# Patient Record
Sex: Female | Born: 1970 | Race: White | Hispanic: No | Marital: Married
Health system: Southern US, Community
[De-identification: ages and names within clinical notes are randomized; demographics above are authoritative.]

---

## 2010-10-06 ENCOUNTER — Ambulatory Visit: Payer: Self-pay | Admitting: Family Medicine

## 2011-01-13 ENCOUNTER — Ambulatory Visit: Payer: Self-pay | Admitting: Family Medicine

## 2011-08-02 ENCOUNTER — Ambulatory Visit: Payer: Self-pay | Admitting: Family Medicine

## 2011-08-22 ENCOUNTER — Ambulatory Visit: Payer: Self-pay | Admitting: Otolaryngology

## 2011-08-29 ENCOUNTER — Ambulatory Visit: Payer: Self-pay | Admitting: Otolaryngology

## 2011-08-29 LAB — PREGNANCY, URINE: Pregnancy Test, Urine: NEGATIVE m[IU]/mL

## 2011-10-01 IMAGING — CR DG FOOT COMPLETE 3+V*L*
1 series · 3 of 3 positions shown · non-contrast
Comparison: none

REASON FOR EXAM: sprain of lt foot
COMMENTS:

PROCEDURE:     MDR - MDR FOOT LT COMP W/OBLQUES  - October 06, 2010 [DATE]
RESULT:     No fracture, dislocation or other acute bony abnormality is
identified. In the lateral view there is incidentally noted a small plantar
calcaneal spur.

[Series 1: view not recorded · 0.17mm/px · 3 of 3 slices shown]
[im 1/3]
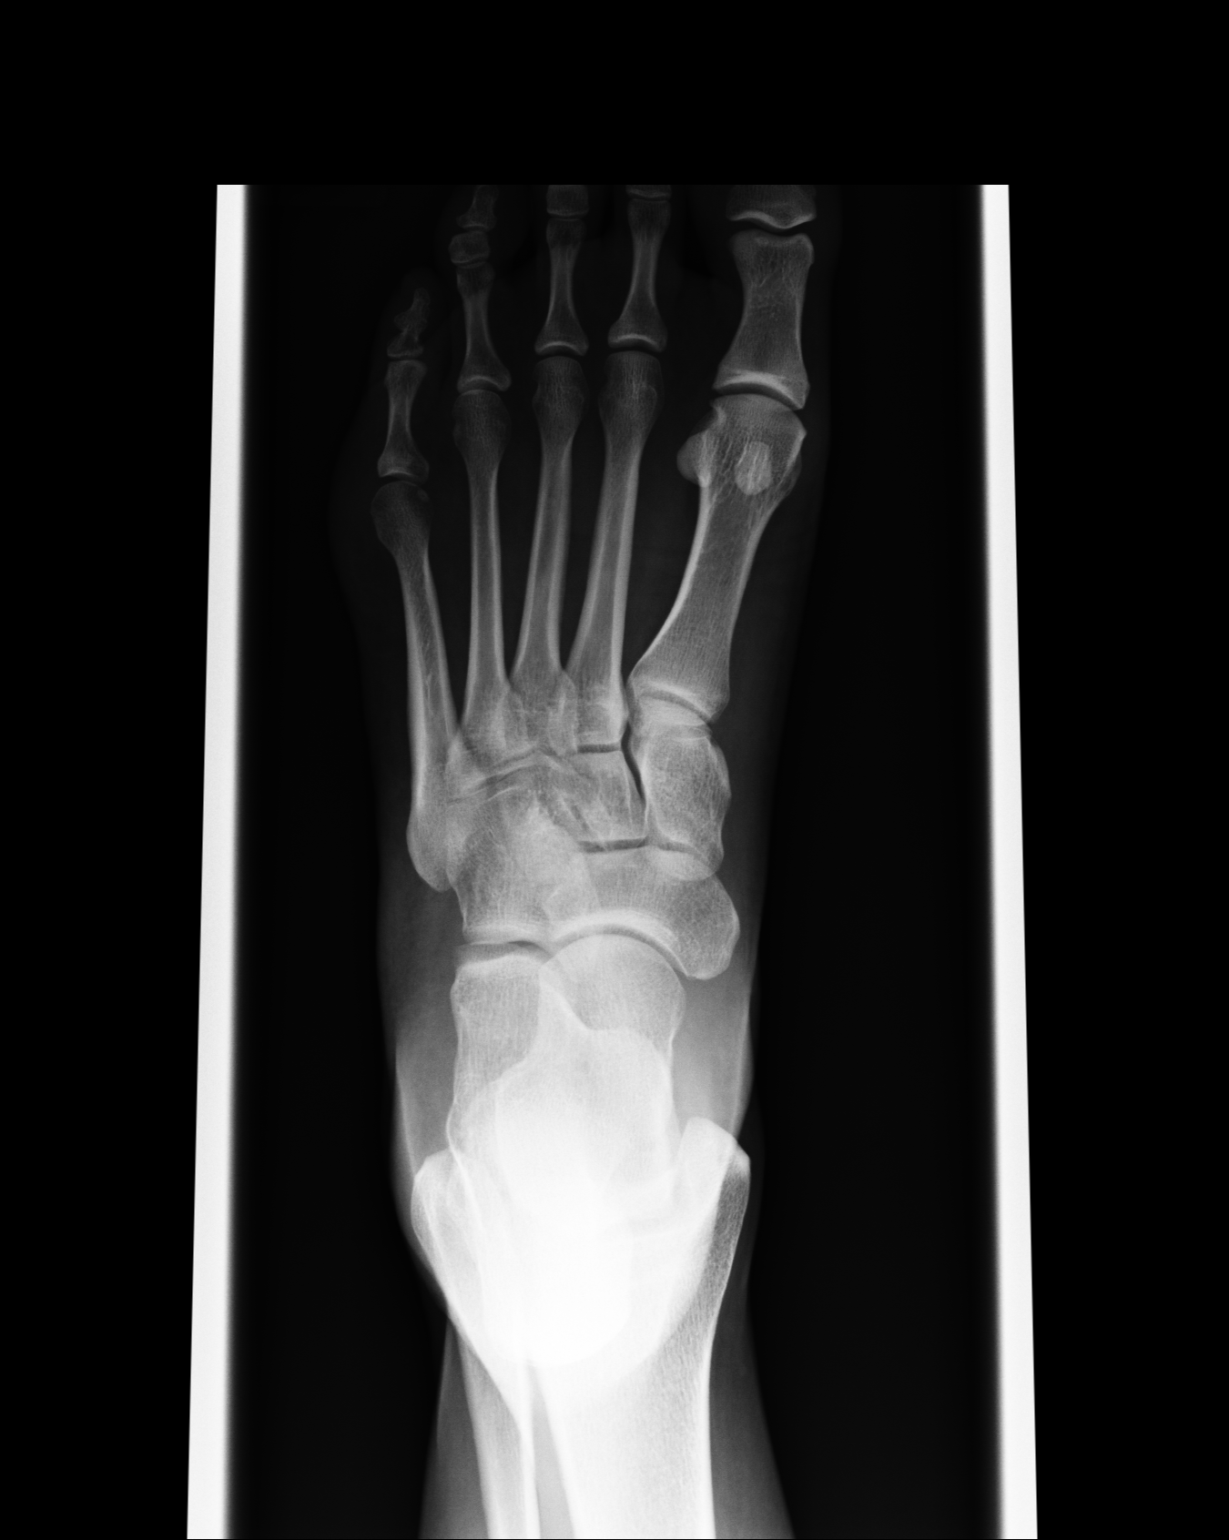
[im 2/3]
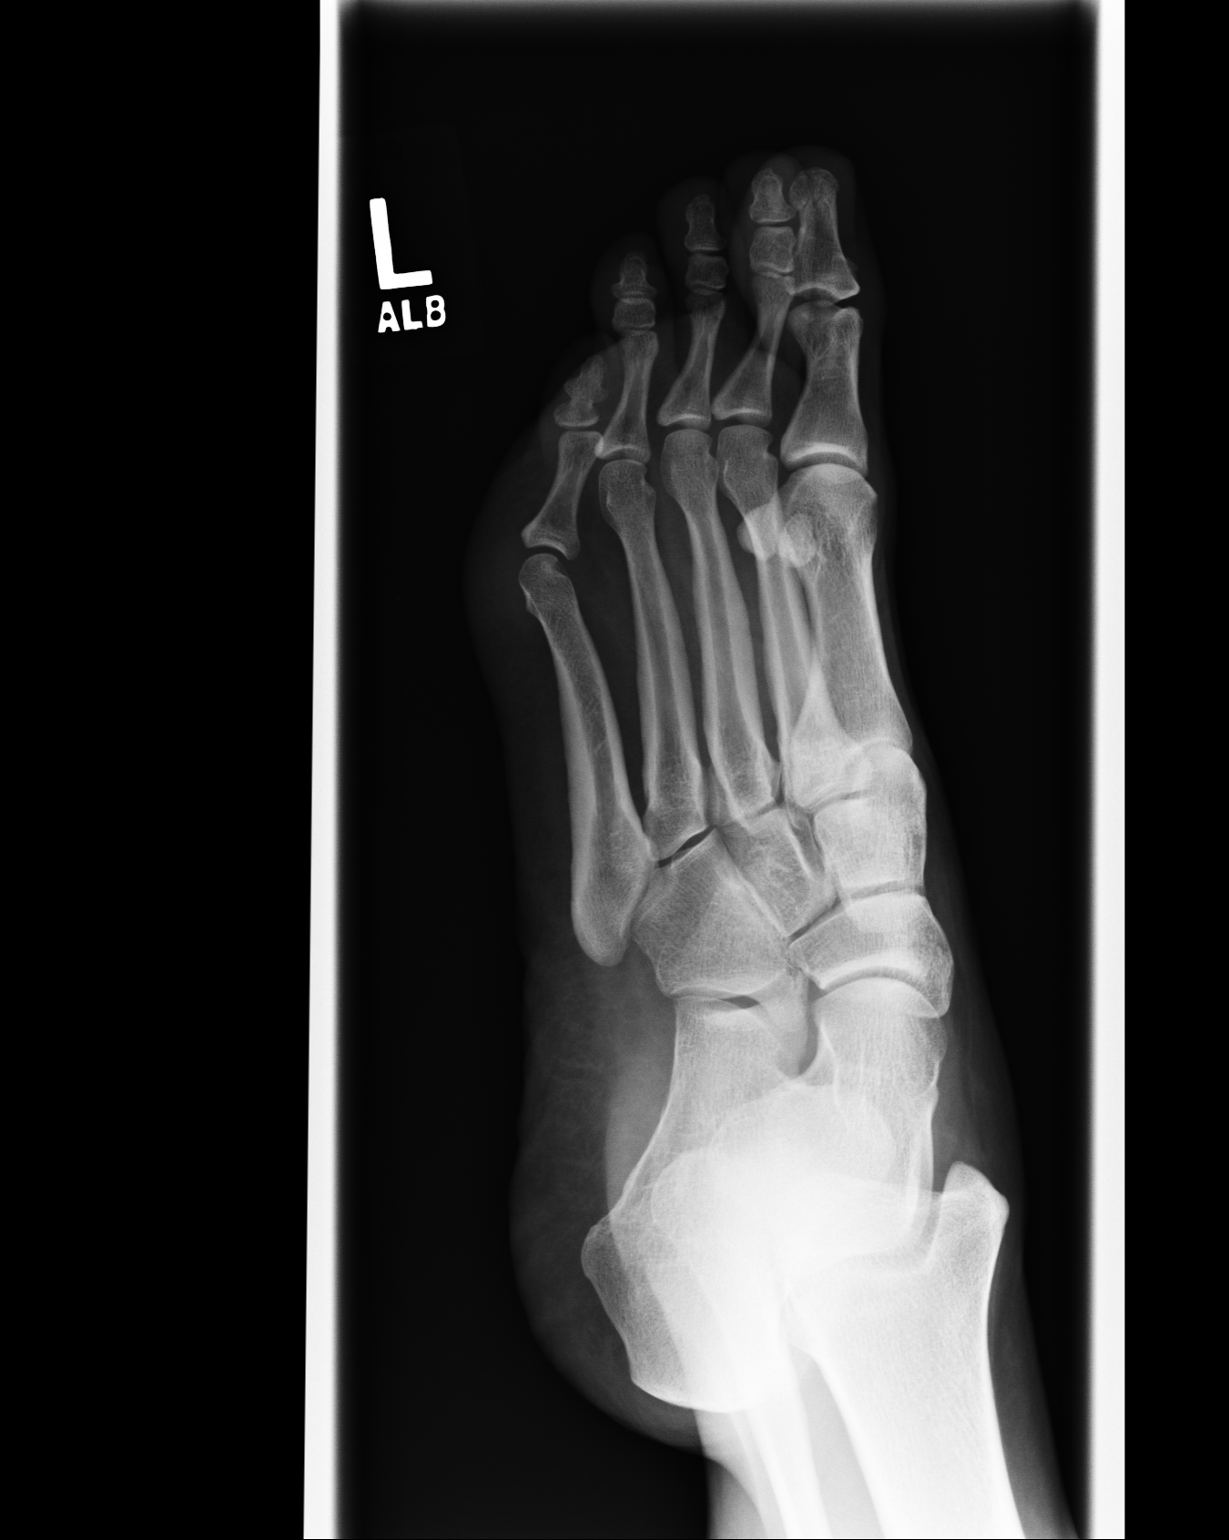
[im 3/3]
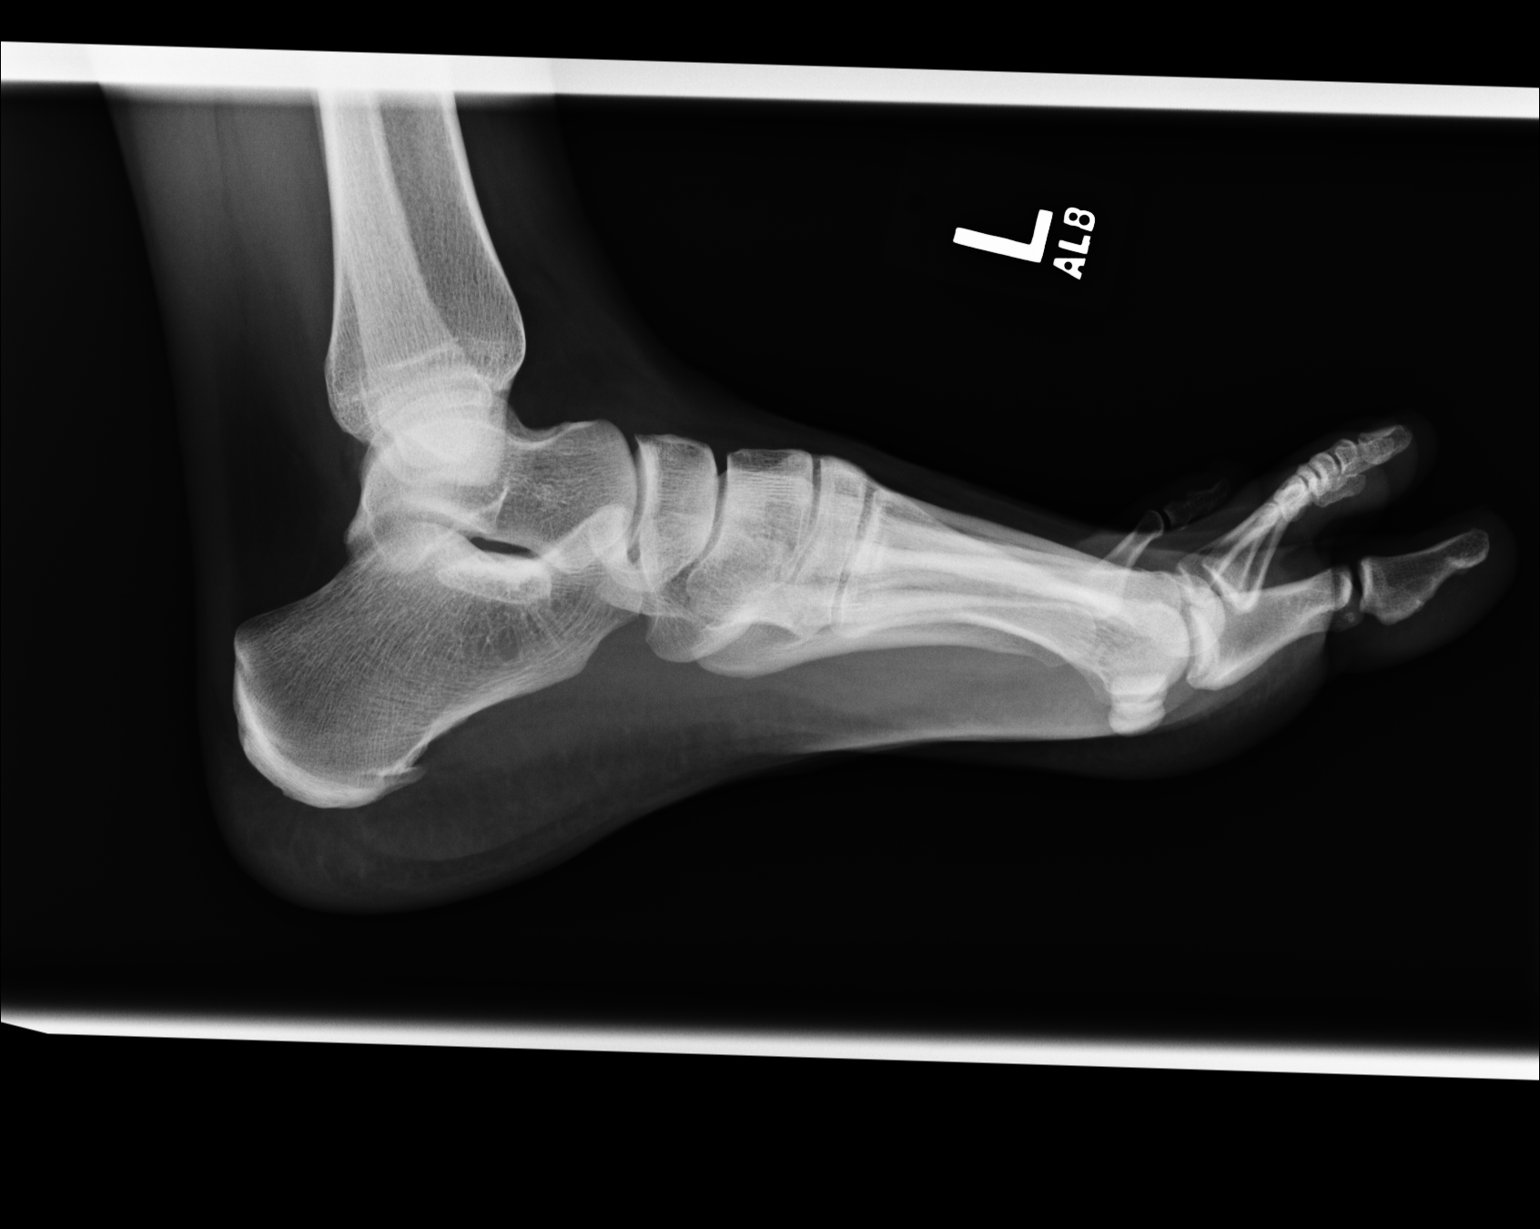

[3 of 3 positions shown; findings below may reference images not displayed]

IMPRESSION: 1. No acute bony abnormalities are identified.
2. There is a small plantar calcaneal spur.

## 2011-10-26 ENCOUNTER — Ambulatory Visit: Payer: Self-pay | Admitting: Otolaryngology

## 2011-10-26 LAB — TSH: Thyroid Stimulating Horm: 4.2 u[IU]/mL

## 2011-12-27 ENCOUNTER — Ambulatory Visit: Payer: Self-pay | Admitting: Otolaryngology

## 2011-12-27 LAB — TSH: Thyroid Stimulating Horm: 4.24 u[IU]/mL

## 2012-03-21 ENCOUNTER — Ambulatory Visit: Payer: Self-pay | Admitting: Otolaryngology

## 2012-03-21 LAB — T4, FREE: Free Thyroxine: 1.22 ng/dL (ref 0.76–1.46)

## 2012-07-27 IMAGING — US US THYROID
1 series · 17 of 25 positions shown · non-contrast
Comparison: none

REASON FOR EXAM: Goiter
COMMENTS:

PROCEDURE:     [facility] - JANPERRI THYROID  - August 02, 2011 [DATE]
RESULT:     Comparison: None
TECHNIQUE: Multiple gray-scale and color-flow Doppler images of the thyroid
gland were obtained.

[Series 1: us thyroid · 17 of 45 slices shown]
[im 1/45]
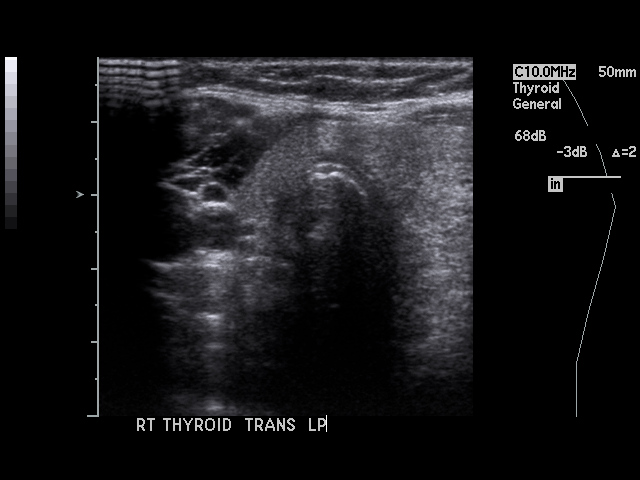
[im 4/45]
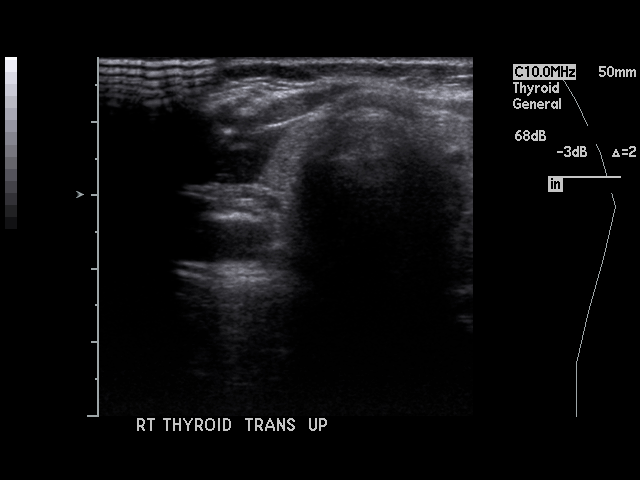
[im 6/45]
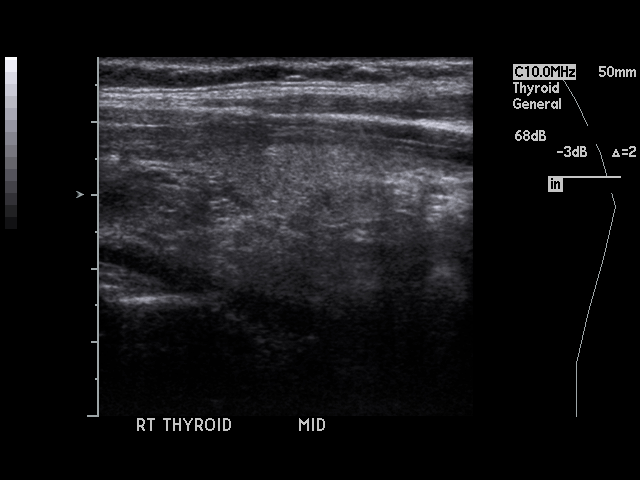
[im 10/45]
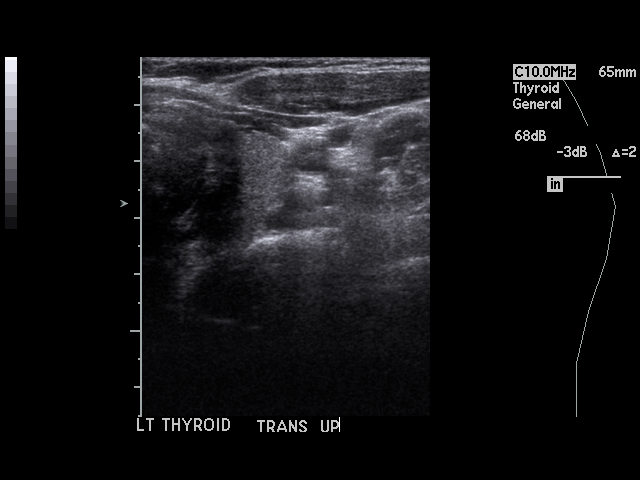
[im 12/45]
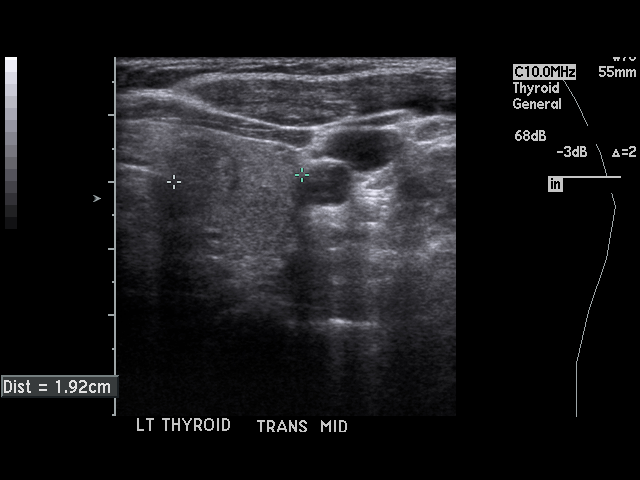
[im 15/45]
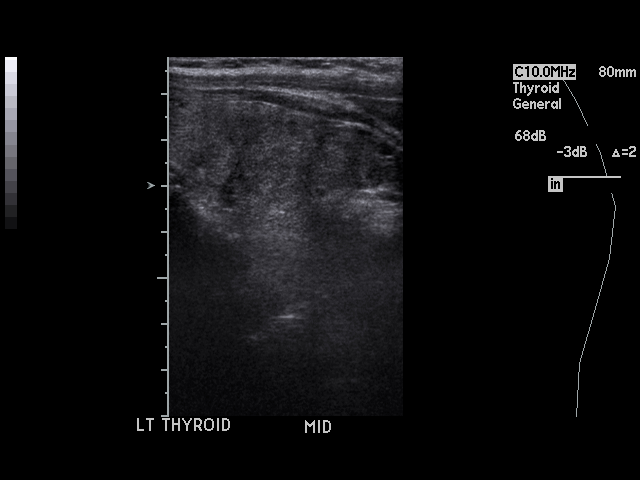
[im 17/45]
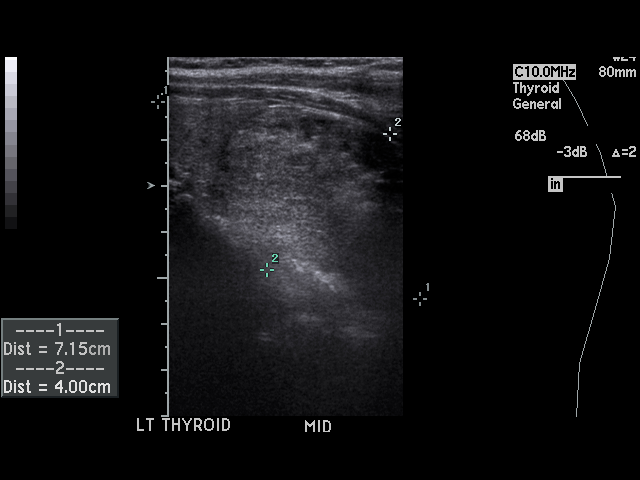
[im 21/45]
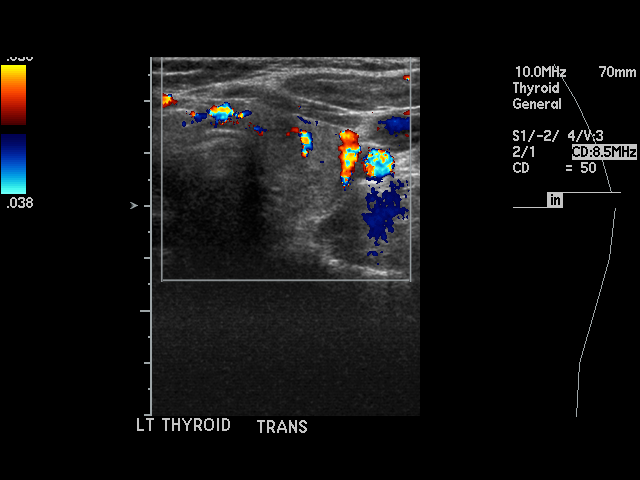
[im 23/45]
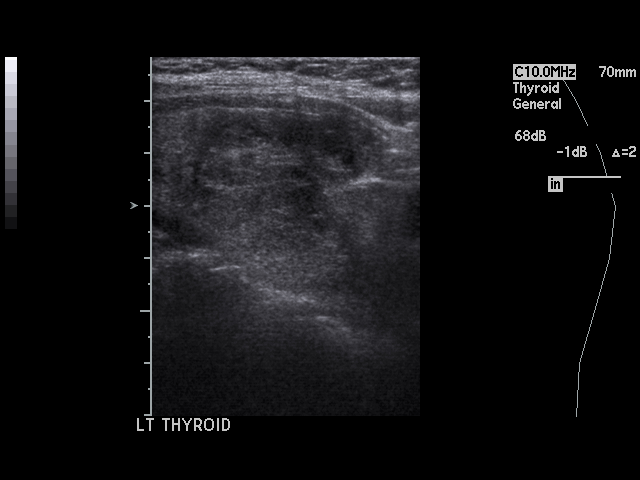
[im 24/45]
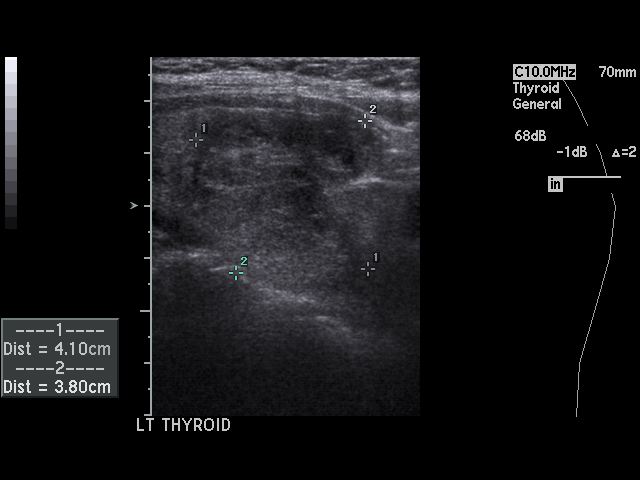
[im 28/45]
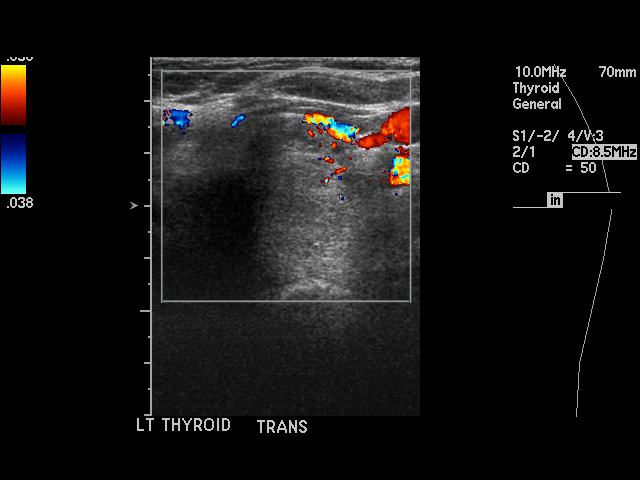
[im 30/45]
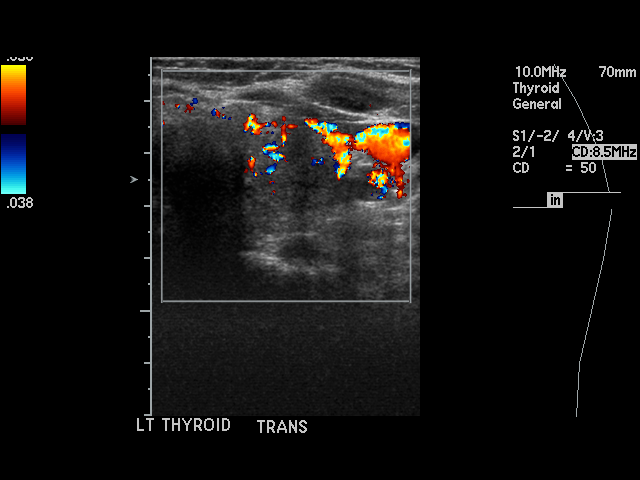
[im 34/45]
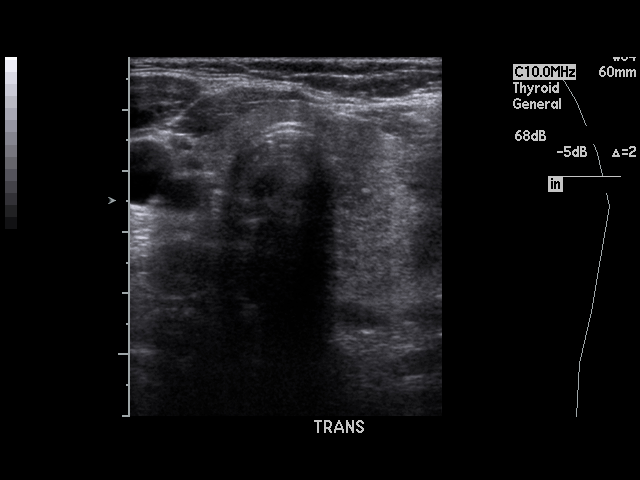
[im 35/45]
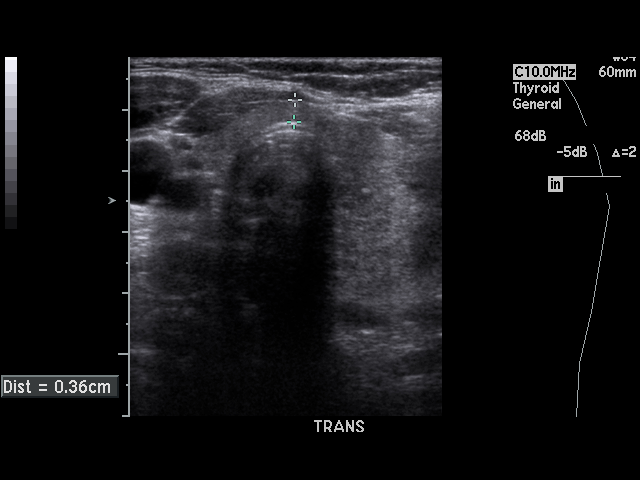
[im 39/45]
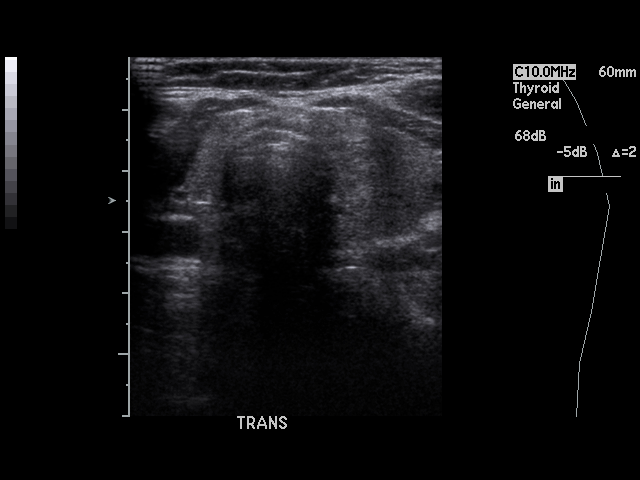
[im 41/45]
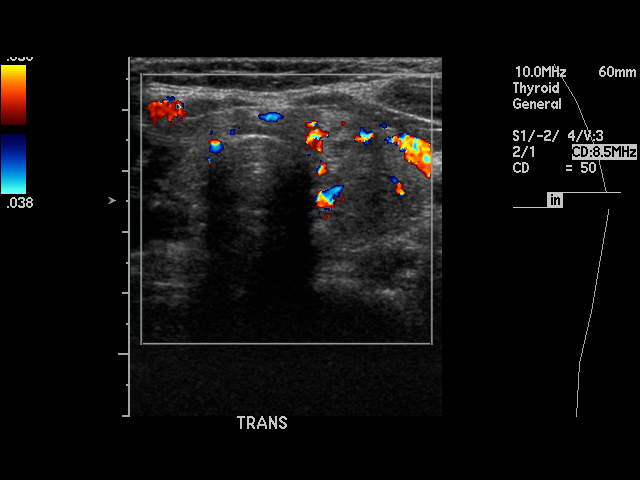
[im 45/45]
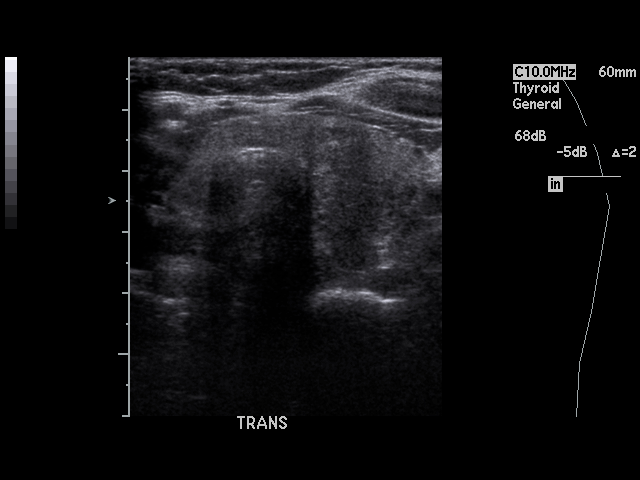

[17 of 25 positions shown; findings below may reference images not displayed]

FINDINGS: The right lobe of the thyroid measures 3.1 x 1.0 x 0.7 cm. The left lobe of
the thyroid measures 7.2 x 4.0 x 1.9 cm. The thyroid isthmus measures 0.4 cm
in thickness. There is a hypoechoic solid nodule in the left lobe of the
thyroid, which measures 4.1 x 3.8 x 2.6 cm. No discrete nodule identified in
the right lobe of the thyroid.
IMPRESSION: Solid 4 cm nodule in the left lobe of the thyroid.

Please note, there are no sensitive or specific ultrasound criteria for
differentiating benign from malignant thyroid lesions.

## 2012-09-19 ENCOUNTER — Ambulatory Visit: Payer: Self-pay | Admitting: Otolaryngology

## 2012-09-19 LAB — T4, FREE: Free Thyroxine: 1.19 ng/dL (ref 0.76–1.46)

## 2012-09-19 LAB — TSH: Thyroid Stimulating Horm: 1.43 u[IU]/mL

## 2013-03-22 ENCOUNTER — Ambulatory Visit: Payer: Self-pay | Admitting: Otolaryngology

## 2013-03-22 LAB — T4, FREE: Free Thyroxine: 1.04 ng/dL (ref 0.76–1.46)

## 2014-03-18 ENCOUNTER — Ambulatory Visit: Payer: Self-pay | Admitting: Otolaryngology

## 2014-03-18 LAB — T4, FREE: Free Thyroxine: 1.15 ng/dL (ref 0.76–1.46)

## 2014-03-18 LAB — TSH: THYROID STIMULATING HORM: 0.381 u[IU]/mL — AB

## 2014-06-18 ENCOUNTER — Ambulatory Visit: Payer: Self-pay | Admitting: Otolaryngology

## 2014-06-18 LAB — T4, FREE: Free Thyroxine: 1.03 ng/dL (ref 0.76–1.46)

## 2014-06-18 LAB — TSH: Thyroid Stimulating Horm: 2.53 u[IU]/mL

## 2014-10-26 NOTE — Op Note (Signed)
PATIENT NAME:  Joan Hogan, Joan Hogan MR#:  [id number] DATE OF BIRTH:  11-Dec-1970  DATE OF PROCEDURE:  08/29/2011  PREOPERATIVE DIAGNOSIS: Large left thyroid nodule.   POSTOPERATIVE DIAGNOSIS: Large left thyroid nodule.   OPERATIVE PROCEDURE: Excision left thyroid gland and isthmus.   SURGEON: Cammy CopaPaul H. Alohilani Levenhagen, MD  ASSISTANT: Dr. Bud Facereighton Vaught  ANESTHESIA: General.   COMPLICATIONS: None.   TOTAL ESTIMATED BLOOD LOSS: 50 mL.   DESCRIPTION OF PROCEDURE: Patient was given general anesthesia by oral endotracheal intubation. A shoulder roll was placed and extended some to get better visualization of the neck. Incision was created about two fingerbreadths above the sternal notch. There is no crease here. The incision slightly more to the left than to the right. The skin was prepped with alcohol. It was then marked and 1% Xylocaine with epinephrine 1:100,000 was used for infiltration of skin, 5 mL was used. She was then prepped and draped in sterile fashion.   Incision is made through the skin, subcutaneous tissue down to the level of the platysma and this was divided and the strap muscles were clearly evident. They were fairly large. The strap muscles were divided in the midline. There was one very large vein that went right up the midline. This was crossclamped and tied with 2-0 silk. Once the strap muscles were divided, elevated over the left thyroid gland, the gland itself is lying in a fairly normal position but then beneath it was a large mass. This appeared to be a more cystic in structure and actually multilobed. The attachments were freed up laterally. There were a lot of blood vessels coming in laterally and these were freed up. The superior attachments were freed up as well and the area of the thyroid gland superiorly was freed so it could rotate some medially. It appeared as though there was a parathyroid gland that was adherent superiorly that was freed up and laid down in the bed. Once the  superior end was freed up attention was drawn to the inferior area. This is where the nodules were and they actually went very deep underneath the clavicle. Able to finally free this up and get around the bottom. There were a couple of large nodules; one smaller one which ruptured as we were pulling it out. There is a very dark brownish fluid that was suctioned clear. Once the bottom was finally freed up, it was pulled into the wound. The entire gland could be rotated medially. The recurrent laryngeal nerve was then found and was clearly evident and spared. The medial attachments of the gland were freed up and then Berry's ligament was freed as well making sure the nerve was under direct vision and spared. The remaining tracheal attachments were freed as well and then the isthmus was freed up and Harmonic scalpel was used to separate the isthmus from the right gland. The specimen was marked and sent for permanent section. The wound was irrigated copiously. One superior parathyroid gland was found but wasn't sure I had found the inferior parathyroid gland. The nerve was restimulated and had good muscle tone as monitored by the endotracheal electrodes.   The wound was irrigated again. There is no further bleeding. Surgicel was placed in the surgical bed and a 10 French TLS drain was placed through a separate inferior stab incision. Strap muscles were closed with 4-0 Vicryl. The platysma was closed with 4-0 Vicryl and then the dermis was closed with 4-0 Vicryl. Skin edges were closed with 6-0 nylon in a  running locking suture. The wound was covered with a little bit of ointment then Telfa and a Tegaderm. Patient tolerated procedure well. She was awakened, taken to recovery room in satisfactory condition. There were no operative complications. Total estimated blood loss was 50 mL.  ____________________________ Cammy Copa, MD phj:cms D: 08/30/2011 08:49:12 ET T: 08/30/2011 11:26:50  ET JOB#: [id number]  cc: Cammy Copa, MD, <Dictator> Cammy Copa MD ELECTRONICALLY SIGNED 09/01/2011 9:53
